# Patient Record
Sex: Male | Born: 2000 | Race: White | Hispanic: No | Marital: Single | Smoking: Never smoker
Health system: Southern US, Community
[De-identification: ages and names within clinical notes are randomized; demographics above are authoritative.]

## PROBLEM LIST (undated history)

## (undated) ENCOUNTER — Emergency Department (HOSPITAL_COMMUNITY): Admission: EM | Payer: Medicaid Other | Source: Home / Self Care

## (undated) DIAGNOSIS — F909 Attention-deficit hyperactivity disorder, unspecified type: Secondary | ICD-10-CM

---
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
## 2021-02-03 ENCOUNTER — Encounter (HOSPITAL_COMMUNITY): Payer: Self-pay

## 2021-02-03 ENCOUNTER — Other Ambulatory Visit: Payer: Self-pay

## 2021-02-03 ENCOUNTER — Emergency Department (HOSPITAL_COMMUNITY)
Admission: EM | Admit: 2021-02-03 | Discharge: 2021-02-03 | Disposition: A | Discharge status: Home / Self Care | Payer: BC Managed Care – PPO | Attending: Emergency Medicine | Admitting: Emergency Medicine

## 2021-02-03 DIAGNOSIS — R112 Nausea with vomiting, unspecified: Secondary | ICD-10-CM | POA: Diagnosis present

## 2021-02-03 DIAGNOSIS — K529 Noninfective gastroenteritis and colitis, unspecified: Secondary | ICD-10-CM | POA: Insufficient documentation

## 2021-02-03 LAB — CBC
HCT: 45 % (ref 39.0–52.0)
Hemoglobin: 15.4 g/dL (ref 13.0–17.0)
MCH: 31.6 pg (ref 26.0–34.0)
MCHC: 34.2 g/dL (ref 30.0–36.0)
MCV: 92.4 fL (ref 80.0–100.0)
Platelets: 247 10*3/uL (ref 150–400)
RBC: 4.87 MIL/uL (ref 4.22–5.81)
RDW: 12.5 % (ref 11.5–15.5)
WBC: 6.4 10*3/uL (ref 4.0–10.5)
nRBC: 0 % (ref 0.0–0.2)

## 2021-02-03 LAB — COMPREHENSIVE METABOLIC PANEL
ALT: 29 U/L (ref 0–44)
AST: 23 U/L (ref 15–41)
Albumin: 4.4 g/dL (ref 3.5–5.0)
Alkaline Phosphatase: 61 U/L (ref 38–126)
Anion gap: 7 (ref 5–15)
BUN: 13 mg/dL (ref 6–20)
CO2: 25 mmol/L (ref 22–32)
Calcium: 8.9 mg/dL (ref 8.9–10.3)
Chloride: 104 mmol/L (ref 98–111)
Creatinine, Ser: 0.98 mg/dL (ref 0.61–1.24)
GFR, Estimated: >60 mL/min (ref >60)
Glucose, Bld: 98 mg/dL (ref 70–99)
Potassium: 3.8 mmol/L (ref 3.5–5.1)
Sodium: 136 mmol/L (ref 135–145)
Total Bilirubin: 0.6 mg/dL (ref 0.3–1.2)
Total Protein: 7.8 g/dL (ref 6.5–8.1)

## 2021-02-03 LAB — LIPASE, BLOOD: Lipase: 29 U/L (ref 11–51)

## 2021-02-03 MED ORDER — LOPERAMIDE HCL 2 MG PO CAPS: 2.0000 mg | ORAL_CAPSULE | Freq: Four times a day (QID) | ORAL | 0 refills | Status: AC | PRN

## 2021-02-03 MED ORDER — ONDANSETRON 8 MG PO TBDP
8.0000 mg | ORAL_TABLET | Freq: Once | ORAL | Status: AC
  Administered 2021-02-03: 8 mg via ORAL
  Filled 2021-02-03: qty 1

## 2021-02-03 MED ORDER — LOPERAMIDE HCL 2 MG PO CAPS
2.0000 mg | ORAL_CAPSULE | Freq: Once | ORAL | Status: AC
  Administered 2021-02-03: 2 mg via ORAL
  Filled 2021-02-03: qty 1

## 2021-02-03 MED ORDER — ONDANSETRON HCL 4 MG PO TABS: 4.0000 mg | ORAL_TABLET | Freq: Four times a day (QID) | ORAL | 0 refills | Status: AC

## 2021-02-03 NOTE — ED Notes (Signed)
Pt educated on need for urine sample. Pt attempting to give sample at this time.

## 2021-02-03 NOTE — ED Triage Notes (Signed)
Pt presents to ED with complaints of diarrhea, weakness and vomiting since Sunday evening.

## 2021-02-03 NOTE — Discharge Instructions (Signed)
Lab work was reassuring, likely this is a viral GI bug, I recommend a bland diet and slowly reintroduce foods as tolerated.  Continue with Zofran for nausea,  Imodium for diarrhea please stay hydrated recommend Gatorade to help replenish electrolytes.  Follow-up PCP as needed.  Come back to the emergency department if you develop chest pain, shortness of breath, severe abdominal pain, uncontrolled nausea, vomiting, diarrhea.

## 2021-02-03 NOTE — ED Notes (Signed)
Pt tolerating oral liquids at this time.

## 2021-02-03 NOTE — ED Provider Notes (Signed)
[facility] Provider Note   CSN: [id number] Arrival date & time: 02/03/21  1355     History  Chief Complaint  Patient presents with   Diarrhea    John Abbott is a 21 y.o. male.  HPI  Patient without significant medical history presents with chief complaint of nausea vomiting diarrhea.   started on Sunday, he states he has had multiple bouts of diarrhea, states he typically has it 2 times daily, states the diarrhea is watery in nature, denies any mucus, foul-smelling, hematochezia or melena, he also notes that he has had a few episodes of vomiting which is started today, denies any hematochezia or coffee-ground emesis.  He states he has slight abdominal tenderness his lower abdomen, remains constant, he is no fevers, chills, chest pain, shortness of breath, general body aches, denies any recent sick contacts, is not immunocompromise.  He states that on Saturday he ate some chips that he typically does not eat and thinks this might of caused him being ill, he states no other family members ill at this time.  No recent hospitalizations, no recent antibiotic use, never had C. difficile in the past, he has no significant abdominal history.  He has not had anything for his symptoms, he states he still tolerating p.o. and is replenishing what is being excreted.  Home Medications Prior to Admission medications   Medication Sig Start Date End Date Taking? Authorizing Provider  loperamide (IMODIUM) 2 MG capsule Take 1 capsule (2 mg total) by mouth 4 (four) times daily as needed for diarrhea or loose stools. 02/03/21  Yes Marcello Fennel, PA-C  ondansetron (ZOFRAN) 4 MG tablet Take 1 tablet (4 mg total) by mouth every 6 (six) hours. 02/03/21  Yes Marcello Fennel, PA-C  acetaminophen (TYLENOL) 500 MG tablet Take 1 tablet (500 mg total) by mouth every 6 (six) hours as needed. 11/01/19   Avegno, Darrelyn Hillock, FNP  cyclobenzaprine (FLEXERIL) 5 MG tablet Take 1 tablet (5 mg  total) by mouth 3 (three) times daily as needed. Patient not taking: Reported on 04/28/2020 11/01/19   Emerson Monte, FNP  ibuprofen (ADVIL) 400 MG tablet Take 400 mg by mouth every 8 (eight) hours as needed.    [provider]  levocetirizine (XYZAL) 5 MG tablet Take 1 tablet (5 mg total) by mouth daily. 05/13/20   Doree Albee, MD  Magnesium Oxide 500 MG TABS Take by mouth.    [provider]  methylphenidate (METADATE CD) 40 MG CR capsule Take 1 capsule (40 mg total) by mouth daily. 07/29/20   Ailene Ards, NP  montelukast (SINGULAIR) 10 MG tablet Take 1 tablet (10 mg total) by mouth at bedtime. 06/18/20   Doree Albee, MD  naproxen (NAPROSYN) 375 MG tablet Take 1 tablet (375 mg total) by mouth 2 (two) times daily. 05/25/20   Scot Jun, FNP      Allergies    Patient has no known allergies.    Review of Systems   Review of Systems  Constitutional:  Negative for chills and fever.  Respiratory:  Negative for shortness of breath.   Cardiovascular:  Negative for chest pain.  Gastrointestinal:  Positive for abdominal pain, diarrhea, nausea and vomiting.  Neurological:  Negative for headaches.   Physical Exam Updated Vital Signs BP 125/72 (BP Location: Right Arm)    Pulse 89    Temp 98 F (36.7 C) (Oral)    Resp 14    Ht 6' (  1.829 m)    Wt 108.9 kg    SpO2 94%    BMI 32.55 kg/m  Physical Exam Vitals and nursing note reviewed.  Constitutional:      General: He is not in acute distress.    Appearance: He is not ill-appearing.  HENT:     Head: Normocephalic and atraumatic.     Right Ear: Tympanic membrane, ear canal and external ear normal.     Left Ear: Tympanic membrane, ear canal and external ear normal.     Nose: No congestion.     Mouth/Throat:     Mouth: Mucous membranes are moist.     Pharynx: Oropharynx is clear. No oropharyngeal exudate or posterior oropharyngeal erythema.  Eyes:     Conjunctiva/sclera: Conjunctivae normal.  Cardiovascular:      Rate and Rhythm: Normal rate and regular rhythm.     Pulses: Normal pulses.     Heart sounds: No murmur heard.   No friction rub. No gallop.  Pulmonary:     Effort: No respiratory distress.     Breath sounds: No wheezing, rhonchi or rales.  Abdominal:     Palpations: Abdomen is soft.     Tenderness: There is no abdominal tenderness. There is no right CVA tenderness or left CVA tenderness.     Comments: Abdomen nondistended, normoactive bowel sounds, dull to percussion, nontender to palpation, no guarding, rebound times, peritoneal negative Murphy sign McBurney point.  Musculoskeletal:     Right lower leg: No edema.     Left lower leg: No edema.  Skin:    General: Skin is warm and dry.  Neurological:     Mental Status: He is alert.  Psychiatric:        Mood and Affect: Mood normal.    ED Results / Procedures / Treatments   Labs (all labs ordered are listed, but only abnormal results are displayed) Labs Reviewed  LIPASE, BLOOD  COMPREHENSIVE METABOLIC PANEL  CBC    EKG None  Radiology No results found.  Procedures Procedures    Medications Ordered in ED Medications  ondansetron (ZOFRAN-ODT) disintegrating tablet 8 mg (8 mg Oral Given 02/03/21 1607)  loperamide (IMODIUM) capsule 2 mg (2 mg Oral Given 02/03/21 1607)    ED Course/ Medical Decision Making/ A&P                           Medical Decision Making  This patient presents to the ED for concern of nausea vomit diarrhea, this involves an extensive number of treatment options, and is a complaint that carries with it a high risk of complications and morbidity.  The differential diagnosis includes bowel obstruction, C. difficile    Additional history obtained:  Additional history obtained from electronic medical record    Co morbidities that complicate the patient evaluation  N/A  Social Determinants of Health:  N/A    Lab Tests:  I Ordered, and personally interpreted labs.  The pertinent  results include: CBC unremarkable, CMP unremarkable lipase unremarkable    Reevaluation:  After the interventions noted above, I reevaluated the patient and found that they have :improved  Reassessed after patient was provided Imodium and Zofran states he is feeling much better, tolerating p.o., he is agreeable for discharge   Rule out low suspicion for lower lobe pneumonia as lung sounds are clear bilaterally, will defer imaging at this time.  I have low suspicion for liver or gallbladder abnormality as she has  no right upper quadrant tenderness, liver enzymes, alk phos, T bili all within normal limits.  Low suspicion for pancreatitis as lipase is within normal limits.  Low suspicion for ruptured stomach ulcer as she has no peritoneal sign present on exam.  Low suspicion for bowel obstruction as abdomen is nondistended normal bowel sounds, so passing gas and having normal bowel movements.  Low suspicion for complicated diverticulitis as she is nontoxic-appearing, vital signs reassuring no leukocytosis present.  Low suspicion for appendicitis as she has no right lower quadrant tenderness, vital signs reassuring.  I have low suspicion for intra-abdominal infection as she has low risk factors, vital signs reassuring, no leukocytosis, will defer imaging at this time.  I have low suspicion for C. difficile as patient is atypical no recent antibiotic use, no hospitalizations, patient never had this in the past     Dispostion and problem list  After consideration of the diagnostic results and the patients response to treatment, I feel that the patent would benefit from   Nausea vomiting diarrhea-likely patient some from viral gastroenteritis will recommend symptom management, follow-up PCP as needed.  Given strict return precautions..             Final Clinical Impression(s) / ED Diagnoses Final diagnoses:  Gastroenteritis    Rx / DC Orders ED Discharge Orders          Ordered     loperamide (IMODIUM) 2 MG capsule  4 times daily PRN        02/03/21 1705    ondansetron (ZOFRAN) 4 MG tablet  Every 6 hours        02/03/21 1705              Marcello Fennel, PA-C 02/03/21 1707    Fredia Sorrow, MD 02/11/21 [phone]

## 2022-09-21 DIAGNOSIS — R42 Dizziness and giddiness: Secondary | ICD-10-CM | POA: Diagnosis not present

## 2022-09-21 DIAGNOSIS — R070 Pain in throat: Secondary | ICD-10-CM | POA: Diagnosis not present

## 2022-09-21 DIAGNOSIS — Z20822 Contact with and (suspected) exposure to covid-19: Secondary | ICD-10-CM | POA: Diagnosis not present

## 2023-10-16 ENCOUNTER — Encounter: Payer: Self-pay | Admitting: Internal Medicine
